# Patient Record
Sex: Male | Born: 1950 | Race: White | Hispanic: No | Marital: Married
Health system: Midwestern US, Academic
[De-identification: ages and names within clinical notes are randomized; demographics above are authoritative.]

---

## 2021-06-10 ENCOUNTER — Encounter: Admit: 2021-06-10 | Discharge: 2021-06-10 | Payer: MEDICARE

## 2021-06-10 ENCOUNTER — Ambulatory Visit: Admit: 2021-06-10 | Discharge: 2021-06-10 | Payer: MEDICARE

## 2021-06-10 DIAGNOSIS — M5136 Other intervertebral disc degeneration, lumbar region: Secondary | ICD-10-CM

## 2021-06-10 DIAGNOSIS — M47817 Spondylosis without myelopathy or radiculopathy, lumbosacral region: Secondary | ICD-10-CM

## 2021-06-10 NOTE — Progress Notes
Dear Dr. Wilford Grist,    I appreciate your kind referral of Collin Luna for evaluation of pain. Please see my note below for the full details of the evaluation and management plan.    Thank you,  Shelia Media, MD    [facility] - Interventional Pain  Subjective     Chief Complaint:   Chief Complaint   Patient presents with   ? Lower Back - Pain   ? Right Hip - Pain   ? 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 male who is referred for evaluation of pain and  has no past medical history on file.  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    Past Medical History:  No past medical history on file.    Family History:  No family history on file.    Social History:  Lives in [place] [id number]  Social History     Socioeconomic History   ? Marital status: Married   Tobacco Use   ? Smoking status: Never   ? Smokeless tobacco: Never       Allergies:  No Known Allergies    Medications:    Current Outpatient Medications:   ?  escitalopram oxalate (LEXAPRO) 10 mg tablet, , Disp: , Rfl:   ?  predniSONE (DELTASONE) 10 mg tablet, , Disp: , Rfl:        Total: 0 Total Score (06/10/2021  1:10 PM)  Opioid Risk Level: Low (06/10/2021  1:10 PM)      Physical examination:   BP 135/73  - Pulse 64  - Ht 180.3 cm (5' 11)  - Wt 79.4 kg (175 lb)  - BMI 24.41 kg/m?   Pain Score: Six    General: Alert, cooperative, no acute distress  HEENT: Normocephalic, atraumatic  Neck: Supple  Lungs: Unlabored respirations  Heart: Regular rate  Skin: Warm and dry to touch  Abdomen: Nondistended    Lumbar spine:  Appearance: No lesions or deformity  Lumbar tenderness: Yes  Pain with extension: Yes  Pain with lateral flexion: Bilateral  Sensation to light touch: Intact and equal in the bilateral lower extremities  Strength: Equal and  adequate bilaterally in the flexors and extensors of the bilateral lower extremities  Straight leg raise: Negative bilaterally  SI joint tenderness: Negative bilaterally  FABER test: Negative bilaterally  FADIR test: Negative bilaterally  Gaenslen test: Deferred  SI joint distraction: Deferred  SI joint compression: Deferred  Thigh thrust: Deferred    Neurological: Alert and oriented x3.    L-Spine Results:  MRI lumbar spine without contrast dated 10/13/2020:  DISC LEVELS:  T12-L1: Unremarkable.  ?  L1-L2: Unremarkable.   ?  L2-L3: There is a mild disc bulge. There is mild bilateral neural   foraminal narrowing.  ?  L3-L4: There is a moderate disc bulge. There is moderate spinal canal   stenosis. There is bilateral neural foraminal narrowing caused by the   disc and facet degenerative change.  ?  L4-L5: There is a moderate degenerative disc bulge. There is spinal   canal stenosis. There is bilateral neural foraminal narrowing left   greater than right. There is facet degenerative change. There is   ligamentum flavum hypertrophy. This level appears to be most   significant.  ?  L5-S1: Unremarkable.  ?  IMPRESSION:  ?  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 No compression fracture or acute disc herniation   is apparent.  ??  X-ray hip right with pelvis dated 03/21/2020 radiology report:   IMPRESSION:  No acute bony pathology of the right hip.          Last Cr and LFT's:  No results found for: CR, AST, ALT, ALKPHOS, TOTBILI       Assessment:  Collin Luna is a 70 y.o. male who presents for evaluation of pain. Based on history, physical exam, and imaging, the pain complaints are most likely due to:    1. DDD (degenerative disc disease), lumbar        2. Spondylosis of lumbosacral region without myelopathy or radiculopathy            He has had an adequate trial of rest, exercise, multimodal treatment, and the passage of time without improvement of symptoms. The pain has significant impact on ability to perform ADLs and quality of life.    Plan:  1.  Medication: Continue current regimen.  2.  Diagnostics: No additional testing today.  3.  Interventions: Bilat L3-5 MBBs in anticipation of RFA    Risks/benefits of all pharmacologic and interventional treatments discussed and questions answered.          CC: Dr. Wilford Grist    Medial Branch Block Checklist:    Moderate to severe low back pain, predominately axial, that causes functional deficit measured on pain or disability scale.  Yes *[x]   No []   Pain present for minimum of 3 months with documented failure to respond to noninvasive conservative management such as rest, medications including NSAIDs if medically tolerated, and PT/HEP  Abscense of untreated radiculopathy  Yes *[x]   No []   NA []      Abscense of untreated neurogenic claudication (except for radiculopathy caused by facet joint synovial cyst)                       Yes *[x]   No []   NA []               There is no non-facet pathology per clinical assessment or radiology studies that could explain the source of the patient 's pain, including but not limited to  Fracture [x]   Tumor [x]  Infection [x]   Significant deformity [x]   Previous radiculopathy treated by ESI injection Yes *[x]   No []     NA []      Has this level been previously ablated greater than 2 years Yes *[]   No []     NA [x]      This level has never had an Anterior Lumbar Interbody Fusion      Yes *[x]   No []          Oswestry:

## 2021-07-02 ENCOUNTER — Encounter: Admit: 2021-07-02 | Discharge: 2021-07-02 | Payer: MEDICARE

## 2021-07-02 ENCOUNTER — Ambulatory Visit: Admit: 2021-07-02 | Discharge: 2021-07-02 | Payer: MEDICARE

## 2021-07-02 DIAGNOSIS — M47817 Spondylosis without myelopathy or radiculopathy, lumbosacral region: Secondary | ICD-10-CM

## 2021-07-02 DIAGNOSIS — M5136 Other intervertebral disc degeneration, lumbar region: Secondary | ICD-10-CM

## 2021-07-02 MED ORDER — BUPIVACAINE (PF) 0.5 % (5 MG/ML) IJ SOLN
6 mL | Freq: Once | INTRAMUSCULAR | 0 refills | Status: CP
Start: 2021-07-02 — End: ?

## 2021-07-02 MED ORDER — LIDOCAINE (PF) 10 MG/ML (1 %) IJ SOLN
2 mL | Freq: Once | INTRAMUSCULAR | 0 refills | Status: CP
Start: 2021-07-02 — End: ?

## 2021-07-02 NOTE — Progress Notes
SPINE CENTER  INTERVENTIONAL PAIN PROCEDURE HISTORY AND PHYSICAL    Chief Complaint: Pain    HISTORY OF PRESENT ILLNESS:  Collin Luna presents today for interventional treatment of his pain. He denies any fevers, chills, infection, or current use of contraindicated anticoagulants. Risks of the procedure were discussed including but not limited to bleeding, infection, damage to surrounding structures and reaction to medications. He reports understanding and has elected to proceed with the procedure.    No past medical history on file.    No past surgical history on file.    family history is not on file.    Social History     Socioeconomic History    Marital status: Married   Tobacco Use    Smoking status: Never    Smokeless tobacco: Never       No Known Allergies    Vitals:    07/02/21 1000   BP: (!) 148/87   BP Source: Arm, Right Upper   Temp: 36.6 C (97.9 F)   SpO2: 98%   O2 Device: None (Room air)   Weight: 80.7 kg (178 lb)   Height: 180.3 cm (5\' 11" )       REVIEW OF SYSTEMS: 10 point ROS obtained and negative except pain    PHYSICAL EXAM:  General: Alert, cooperative, no distress  Head: Normocephalic, atraumatic  Lungs: Unlabored respirations  Heart: Well perfused  Abdomen: Non-distended  Musculoskeletal: Moves all extremities  Neurological: Grossly intact      IMPRESSION:    1. DDD (degenerative disc disease), lumbar    2. Spondylosis of lumbosacral region without myelopathy or radiculopathy         PLAN: Other Bilat L3-5 MBBs

## 2021-07-02 NOTE — Discharge Instructions - Supplementary Instructions
MEDIAL BRANCH BLOCK   DISCHARGE INSTRUCTIONS    Important information following your procedure today:     This injection is for diagnostic purposes, it is a test block. Only short-term results are expected.  Though the procedure is generally safe, and complications are rare, we do ask that you be aware of any of the following:   Any swelling, persistent redness, new bleeding, or drainage from the site of the injection.  You should not experience a severe headache.  You should not run a fever over 101? F.  New onset of sharp, severe back & or neck pain.  New onset of upper or lower extremity numbness or weakness.  New difficulty controlling bowel or bladder function after the injection.  New shortness of breath.    ** If any of these occur, please call to report this occurrence to a nurse at [phone]. If you are calling after 4:00 p.m. or on weekends or holidays, please call [phone] and ask to have the resident physician on call for the physician paged or go to your local emergency room.  You may experience soreness at the injection site. Ice can be applied at 20-minute intervals. Avoid application of direct heat, hot showers or hot tubs today.  Patients taking a daily blood thinner can resume their regular dose this evening.  It is important that you take all medications ordered by your pain physician. Taking medication as ordered is an important part of your pain care plan. If you cannot continue the medication plan, please notify the physician.   Remain active today. Do the activities that would normally cause you pain.  It is important for you to keep track of the results of this test on paper.  Did you get relief?  Percentage of relief?   How long did it last?    Call and report the results to your physician's nurse at the office number listed below; you may have to leave a message. Use notes that you kept on your pain diary when giving your report.              PAIN DIARY    Please report pain on 0-10 scale for each hour listed following discharge.  (0 = No pain; 5 = Moderate pain; 10 = Worst pain of your life)      TIME DAY 1 DAY 2   12AM MIDNIGHT     1AM     2AM     3AM     4AM      5AM     6AM     7AM     8AM     9AM     10AM     11AM     12PM NOON     1PM     2PM     3PM     4PM     5PM     6PM      7PM     8PM     9PM     10PM     11PM        Dr. Meredith Leeds nurse:        [phone]   Dr. Tamsen Roers nurse:   [phone]   Dr. Ralph Dowdy nurse: [phone]   Dr. Mellody Life nurse:       [phone]   Dr. Joan Flores nurse:      [phone]   Dr. Larene Beach nurse:      [phone]   Dr. Arvil Chaco nurse:  [phone]   Dr. Michaelle Copas nurse:     [phone]   Dr. Juel Burrow nurse:  [phone]         Please contact your provider's office to report your maximal percent relief from your test blocks today and the duration of relief.    **If you did not get any relief, please also mention this as well, as this may affect the next steps in your care.       If you are unable to keep your upcoming appointment, please notify the [facility] scheduler at [phone] at least 24 hours in advance. If you have questions for the surgery center, call [facility] at [phone].

## 2021-07-02 NOTE — Procedures
Attending Surgeon: Helayne Seminole, MD    Anesthesia: Local    Pre-Procedure Diagnosis:   1. DDD (degenerative disc disease), lumbar    2. Spondylosis of lumbosacral region without myelopathy or radiculopathy        Post-Procedure Diagnosis:   1. DDD (degenerative disc disease), lumbar    2. Spondylosis of lumbosacral region without myelopathy or radiculopathy             [place] [facility] INJECT MBB LUMB/SACRAL  Procedure: medial branch block    Laterality: bilateral    Location: - L4, L5 and L3      Consent:   Consent obtained: verbal and written  Consent given by: patient  Risks discussed: allergic reaction, bleeding, bruising, infection, nerve damage, no change or worsening in pain, pneumo thorax, reaction to medication, swelling, weakness and seizure  Alternatives discussed: alternative treatment, delayed treatment, no treatment and referral  Discussed with patient the purpose of the treatment/procedure, other ways of treating my condition, including no treatment/ procedure and the risks and benefits of the alternatives. Patient has decided to proceed with treatment/procedure.        Universal Protocol:  Relevant documents: relevant documents present and verified  Test results: test results available and properly labeled  Imaging studies: imaging studies available  Required items: required blood products, implants, devices, and special equipment available  Site marked: the operative site was marked  Patient identity confirmed: Patient identify confirmed verbally with patient.        Time out: Immediately prior to procedure a time out was called to verify the correct patient, procedure, equipment, support staff and site/side marked as required      Procedures Details:   Indications: diagnostic evaluation   Prep: chlorhexidine  Specimens: none  Number of Levels: 2  Guidance: fluoroscopy  Needle and Epidural Catheter: quincke  Needle size: 25 G  Injection procedure: Incremental injection and Negative aspiration for blood  Amount Injected:   L3: 1mL  L4: 1mL  L5: 1mL  Patient tolerance: Patient tolerated the procedure well with no immediate complications. Pressure was applied, and hemostasis was accomplished.  Comments: ANESTHESIA PROCEDURE REPORT    Lumbar Facet Medial Branch Blocks    Date of Service: 07/02/2021    Procedure Title(s):    1. Bilateral medial branch nerve blocks to the bilateral L3-5 facet joints   2. Intraoperative fluoroscopy     Attending Surgeon: Helayne Seminole, MD    Anesthesia: Local anesthesia    Indications: Collin Luna is a 70 y.o. male with a diagnosis of low back pain and lumbar spondylosis without myelopathy. The patient's history and physical exam were reviewed. The patient has failed conservative measures including physical therapy and medication management. On exam the patients exhibits significant tenderness in the above stated levels which is exacerbated by extension and lateral flexion to the painful sides. The risks, benefits and alternatives to the procedure were discussed, and all questions were answered to the patient's satisfaction. The patient agreed to proceed, and written informed consent was obtained.    Procedure in Detail:     The patient was brought into the procedure room and placed in the prone position on the fluoroscopy table. Standard monitors were placed, and vital signs were observed throughout the procedure. The area of the lumbar spine was prepped with 2% chlorhexidine and draped in a sterile manner.     AP fluoroscopy with oblique tilt to the right for medial branches on the right and was  used to identify the junction of the superior articular process and the transverse process at the L3-5 levels on the right side. A 25-gauge spinal needle was advanced toward each of these points under fluoroscopic guidance. Once bone was contacted, negative aspiration was confirmed and 1 mL of 0.5% bupivacaine was injected at each level.    The same procedure was performed on the opposite side?  Yes    After the procedure was completed, the patient's back was cleaned and bandages were placed at the needle insertion sites.    Disposition: The patient tolerated the procedure well, and there were no apparent complications. Vital signs remained stable througtout the procedure. The patient was taken to the recovery area where discharge instructions for the procedure were given. The patient was given a pain diary to assess response to this diagnostic block. If the patient does experience significant pain relief with the diagnostic block, consideration will be given to radiofrequency ablation of the above treated levels at a subsequent visit.    Estimated Blood Loss: Minimal    Specimens: None    Complications: None                Administrations This Visit     bupivacaine PF (MARCAINE) 0.5 % injection 6 mL     Admin Date  07/02/2021 Action  Given Dose  6 mL Route  Injection Administered By  Ennis Forts, RN          lidocaine PF 1% (10 mg/mL) injection 2 mL     Admin Date  07/02/2021 Action  Given Dose  2 mL Route  Injection Administered By  Ennis Forts, RN              Estimated blood loss: none or minimal  Specimens: none  Patient tolerated the procedure well with no immediate complications. Pressure was applied, and hemostasis was accomplished.

## 2021-07-06 ENCOUNTER — Encounter: Admit: 2021-07-06 | Discharge: 2021-07-06 | Payer: MEDICARE

## 2021-07-06 NOTE — Telephone Encounter
Patient calling to report the bilateral L3-L5 MBB  #1 results. Patient experienced up to 75 %  pain relief relief for 3-4 hours. Will continue with plan to see patient on 08/06/21 for MBB#2

## 2021-08-06 ENCOUNTER — Encounter: Admit: 2021-08-06 | Discharge: 2021-08-06 | Payer: MEDICARE

## 2021-08-06 ENCOUNTER — Ambulatory Visit: Admit: 2021-08-06 | Discharge: 2021-08-06 | Payer: MEDICARE

## 2021-08-06 DIAGNOSIS — M47817 Spondylosis without myelopathy or radiculopathy, lumbosacral region: Secondary | ICD-10-CM

## 2021-08-06 DIAGNOSIS — M5136 Other intervertebral disc degeneration, lumbar region: Secondary | ICD-10-CM

## 2021-08-06 MED ORDER — LIDOCAINE (PF) 10 MG/ML (1 %) IJ SOLN
2 mL | Freq: Once | INTRAMUSCULAR | 0 refills | Status: CP
Start: 2021-08-06 — End: ?

## 2021-08-06 MED ORDER — BUPIVACAINE (PF) 0.5 % (5 MG/ML) IJ SOLN
6 mL | Freq: Once | INTRAMUSCULAR | 0 refills | Status: CP
Start: 2021-08-06 — End: ?

## 2021-08-06 NOTE — Progress Notes
SPINE CENTER  INTERVENTIONAL PAIN PROCEDURE HISTORY AND PHYSICAL    Chief Complaint: Pain    HISTORY OF PRESENT ILLNESS:  Collin Luna presents today for interventional treatment of his pain. He denies any fevers, chills, infection, or current use of contraindicated anticoagulants. Risks of the procedure were discussed including but not limited to bleeding, infection, damage to surrounding structures and reaction to medications. He reports understanding and has elected to proceed with the procedure.    Patient calling to report the bilateral L3-L5 MBB  #1 results. Patient experienced up to 75-80%  pain relief relief for 3-4 hours. Will continue with plan to see patient on 08/06/21 for MBB#2    History reviewed. No pertinent past medical history.    History reviewed. No pertinent surgical history.    family history is not on file.    Social History     Socioeconomic History    Marital status: Married   Tobacco Use    Smoking status: Never    Smokeless tobacco: Never       No Known Allergies    Vitals:    08/06/21 1055   BP: 136/77   BP Source: Arm, Right Upper   SpO2: 100%   O2 Device: None (Room air)   Weight: 79.8 kg (176 lb)   Height: 180.3 cm (5\' 11" )       REVIEW OF SYSTEMS: 10 point ROS obtained and negative except pain    PHYSICAL EXAM:  General: Alert, cooperative, no distress  Head: Normocephalic, atraumatic  Lungs: Unlabored respirations  Heart: Well perfused  Abdomen: Non-distended  Musculoskeletal: Moves all extremities  Neurological: Grossly intact      IMPRESSION:    1. DDD (degenerative disc disease), lumbar    2. Spondylosis of lumbosacral region without myelopathy or radiculopathy         PLAN: Other bilat L3-5 MBBs

## 2021-08-06 NOTE — Procedures
Attending Surgeon: Helayne Seminole, MD    Anesthesia: Local    Pre-Procedure Diagnosis:   1. DDD (degenerative disc disease), lumbar    2. Spondylosis of lumbosacral region without myelopathy or radiculopathy        Post-Procedure Diagnosis:   1. DDD (degenerative disc disease), lumbar    2. Spondylosis of lumbosacral region without myelopathy or radiculopathy             [facility] SP[facility] INJECT MBB LUMB/SACRAL  Procedure: medial branch block    Laterality: bilateral    Location: - L4, L5 and L3      Consent:   Consent obtained: verbal and written  Consent given by: patient  Risks discussed: allergic reaction, bleeding, bruising, infection, nerve damage, no change or worsening in pain, pneumo thorax, reaction to medication, swelling, weakness and seizure  Alternatives discussed: alternative treatment, delayed treatment, no treatment and referral  Discussed with patient the purpose of the treatment/procedure, other ways of treating my condition, including no treatment/ procedure and the risks and benefits of the alternatives. Patient has decided to proceed with treatment/procedure.        Universal Protocol:  Relevant documents: relevant documents present and verified  Test results: test results available and properly labeled  Imaging studies: imaging studies available  Required items: required blood products, implants, devices, and special equipment available  Site marked: the operative site was marked  Patient identity confirmed: Patient identify confirmed verbally with patient.        Time out: Immediately prior to procedure a time out was called to verify the correct patient, procedure, equipment, support staff and site/side marked as required      Procedures Details:   Indications: diagnostic evaluation   Prep: chlorhexidine  Specimens: none  Number of Levels: 2  Guidance: fluoroscopy  Needle and Epidural Catheter: quincke  Needle size: 25 G  Injection procedure: Incremental injection and Negative aspiration for blood  Amount Injected:   L3: 1mL  L4: 1mL  L5: 1mL  Patient tolerance: Patient tolerated the procedure well with no immediate complications. Pressure was applied, and hemostasis was accomplished.  Comments: ANESTHESIA PROCEDURE REPORT    Lumbar Facet Medial Branch Blocks    Date of Service: 08/06/2021    Procedure Title(s):    1. Bilateral medial branch nerve blocks to the bilateral L3-5 facet joints   2. Intraoperative fluoroscopy     Attending Surgeon: Helayne Seminole, MD    Anesthesia: Local anesthesia    Indications: Collin Luna is a 71 y.o. male with a diagnosis of low back pain and lumbar spondylosis without myelopathy. The patient's history and physical exam were reviewed. The patient has failed conservative measures including physical therapy and medication management. On exam the patients exhibits significant tenderness in the above stated levels which is exacerbated by extension and lateral flexion to the painful sides. The risks, benefits and alternatives to the procedure were discussed, and all questions were answered to the patient's satisfaction. The patient agreed to proceed, and written informed consent was obtained.    Procedure in Detail:     The patient was brought into the procedure room and placed in the prone position on the fluoroscopy table. Standard monitors were placed, and vital signs were observed throughout the procedure. The area of the lumbar spine was prepped with 2% chlorhexidine and draped in a sterile manner.     AP fluoroscopy with oblique tilt to the right for medial branches on the right and was  used to identify the junction of the superior articular process and the transverse process at the L4 and L5 levels on the right side. A 25-gauge spinal needle was advanced toward each of these points under fluoroscopic guidance. Once bone was contacted, negative aspiration was confirmed and 1 mL of 0.5% bupivacaine was injected at each level.    The same procedure was performed on the opposite side?  Yes    After the procedure was completed, the patient's back was cleaned and bandages were placed at the needle insertion sites.    Disposition: The patient tolerated the procedure well, and there were no apparent complications. Vital signs remained stable througtout the procedure. The patient was taken to the recovery area where discharge instructions for the procedure were given. The patient was given a pain diary to assess response to this diagnostic block. If the patient does experience significant pain relief with the diagnostic block, consideration will be given to radiofrequency ablation of the above treated levels at a subsequent visit.    Estimated Blood Loss: Minimal    Specimens: None    Complications: None                Administrations This Visit     bupivacaine PF (MARCAINE) 0.5 % injection 6 mL     Admin Date  08/06/2021 Action  Given Dose  6 mL Route  Injection Administered By  Helayne Seminole, MD          lidocaine PF 1% (10 mg/mL) injection 2 mL     Admin Date  08/06/2021 Action  Given Dose  2 mL Route  Injection Administered By  Helayne Seminole, MD              Estimated blood loss: none or minimal  Specimens: none  Patient tolerated the procedure well with no immediate complications. Pressure was applied, and hemostasis was accomplished.

## 2021-08-06 NOTE — Discharge Instructions - Supplementary Instructions
MEDIAL BRANCH BLOCK   DISCHARGE INSTRUCTIONS    Important information following your procedure today:     This injection is for diagnostic purposes, it is a test block. Only short-term results are expected.  Though the procedure is generally safe, and complications are rare, we do ask that you be aware of any of the following:   Any swelling, persistent redness, new bleeding, or drainage from the site of the injection.  You should not experience a severe headache.  You should not run a fever over 101? F.  New onset of sharp, severe back & or neck pain.  New onset of upper or lower extremity numbness or weakness.  New difficulty controlling bowel or bladder function after the injection.  New shortness of breath.    ** If any of these occur, please call to report this occurrence to a nurse at [phone]. If you are calling after 4:00 p.m. or on weekends or holidays, please call [phone] and ask to have the resident physician on call for the physician paged or go to your local emergency room.  You may experience soreness at the injection site. Ice can be applied at 20-minute intervals. Avoid application of direct heat, hot showers or hot tubs today.  Patients taking a daily blood thinner can resume their regular dose this evening.  It is important that you take all medications ordered by your pain physician. Taking medication as ordered is an important part of your pain care plan. If you cannot continue the medication plan, please notify the physician.   Remain active today. Do the activities that would normally cause you pain.  It is important for you to keep track of the results of this test on paper.  Did you get relief?  Percentage of relief?   How long did it last?    Call and report the results to your physician's nurse at the office number listed below; you may have to leave a message. Use notes that you kept on your pain diary when giving your report.              PAIN DIARY    Please report pain on 0-10 scale for each hour listed following discharge.  (0 = No pain; 5 = Moderate pain; 10 = Worst pain of your life)      TIME DAY 1 DAY 2   12AM MIDNIGHT     1AM     2AM     3AM     4AM      5AM     6AM     7AM     8AM     9AM     10AM     11AM     12PM NOON     1PM     2PM     3PM     4PM     5PM     6PM      7PM     8PM     9PM     10PM     11PM        Dr. Meredith Leeds nurse:        [phone]   Dr. Tamsen Roers nurse:   [phone]   Dr. Ralph Dowdy nurse: [phone]   Dr. Mellody Life nurse:       [phone]   Dr. Joan Flores nurse:      [phone]   Dr. Larene Beach nurse:      [phone]   Dr. Arvil Chaco nurse:  [phone]   Dr. Michaelle Copas nurse:     [phone]   Dr. Juel Burrow nurse:  [phone]         Please contact your provider's office to report your maximal percent relief from your test blocks today and the duration of relief.    **If you did not get any relief, please also mention this as well, as this may affect the next steps in your care.       If you are unable to keep your upcoming appointment, please notify the [facility] scheduler at [phone] at least 24 hours in advance. If you have questions for the surgery center, call [facility] at [phone].

## 2021-08-10 ENCOUNTER — Encounter: Admit: 2021-08-10 | Discharge: 2021-08-10 | Payer: MEDICARE

## 2021-08-10 NOTE — Telephone Encounter
[place] [facility] SPINE INJECT MBB LUMB/SACRAL  Procedure: medial branch block   Laterality: bilateral  Location: - L4, L5 and L3      Patient calling to report that he got about 80% relief for 6 hours following the MBB. Was able to go home and be more active afterwards. Patient scheduled for RFA.

## 2021-08-20 ENCOUNTER — Encounter: Admit: 2021-08-20 | Discharge: 2021-08-20 | Payer: MEDICARE

## 2021-08-20 ENCOUNTER — Ambulatory Visit: Admit: 2021-08-20 | Discharge: 2021-08-20 | Payer: MEDICARE

## 2021-08-20 DIAGNOSIS — M5136 Other intervertebral disc degeneration, lumbar region: Secondary | ICD-10-CM

## 2021-08-20 DIAGNOSIS — M47817 Spondylosis without myelopathy or radiculopathy, lumbosacral region: Secondary | ICD-10-CM

## 2021-08-20 MED ORDER — FENTANYL CITRATE (PF) 50 MCG/ML IJ SOLN
50-100 ug | INTRAVENOUS | 0 refills | Status: AC | PRN
Start: 2021-08-20 — End: ?

## 2021-08-20 MED ORDER — BUPIVACAINE (PF) 0.5 % (5 MG/ML) IJ SOLN
10 mL | Freq: Once | INTRAMUSCULAR | 0 refills | Status: CP
Start: 2021-08-20 — End: ?

## 2021-08-20 MED ORDER — MIDAZOLAM 1 MG/ML IJ SOLN
1-2 mg | INTRAVENOUS | 0 refills | Status: AC | PRN
Start: 2021-08-20 — End: ?

## 2021-08-20 MED ORDER — LIDOCAINE (PF) 20 MG/ML (2 %) IJ SOLN
5 mL | Freq: Once | INTRAMUSCULAR | 0 refills | Status: CP
Start: 2021-08-20 — End: ?

## 2021-08-20 MED ORDER — LIDOCAINE (PF) 10 MG/ML (1 %) IJ SOLN
.2 mL | INTRAMUSCULAR | 0 refills | Status: AC | PRN
Start: 2021-08-20 — End: ?

## 2021-08-20 NOTE — Procedures
Attending Surgeon: Helayne Seminole, MD    Anesthesia: Local    Pre-Procedure Diagnosis:   1. Spondylosis of lumbosacral region without myelopathy or radiculopathy    2. DDD (degenerative disc disease), lumbar        Post-Procedure Diagnosis:   1. Spondylosis of lumbosacral region without myelopathy or radiculopathy    2. DDD (degenerative disc disease), lumbar             Destruction of Nerve w/Fluoro Lumbar/Sacral    Laterality: bilateral    Location: Lumbar/Sacral -  L4, L5 and L3      Consent:   Consent obtained: verbal and written  Consent given by: patient  Risks discussed: allergic reaction, bleeding, no change or worsening in pain, nerve damage, infection, reaction to medication, sensation loss, skin burn, swelling, weakness and bruising  Alternatives discussed: alternative treatment, delayed treatment, no treatment and referral  Discussed with patient the purpose of the treatment/procedure, other ways of treating my condition, including no treatment/ procedure and the risks and benefits of the alternatives. Patient has decided to proceed with treatment/procedure.        Universal Protocol:  Relevant documents: relevant documents present and verified  Test results: test results available and properly labeled  Imaging studies: imaging studies available  Required items: required blood products, implants, devices, and special equipment available  Site marked: the operative site was marked  Patient identity confirmed: Patient identify confirmed verbally with patient.        Time out: Immediately prior to procedure a time out was called to verify the correct patient, procedure, equipment, support staff and site/side marked as required      Procedures Details:   Indications: pain     Prep: chlorhexidine    Sedation: anxiolysisPatient position: prone  Estimated Blood Loss: minimal  Specimens: none  Amount Injected:   L3: 1mL  L4: 1mL  L5: 1mL  Number of Levels Ablated: 2  Approach: paramedian  Guidance: fluoroscopy  Needle size: 18 G  Active Needle Tip Length: 10mm  Neurolytic Technique: Radiofrequency Ablation  Sensory Testing: Sensory testing complete per protocol  Motor Testing: Motor testing complete per protocol   Injection procedure: Incremental injection and Negative aspiration for blood  Patient tolerance: Patient tolerated the procedure well with no immediate complications. Pressure was applied, and hemostasis was accomplished.  Comments:                   INTERVENTIONAL PAIN MANAGEMENT PROCEDURE REPORT    Radiofrequency Ablation (RFA) of Lumbar Medial Branch Nerves    Date of Service: 08/20/2021    Procedure Title(s):    1. Radiofrequency ablation of bilateral L3-L5 facet joints   2. Intraoperative fluoroscopy    Attending Surgeon: Helayne Seminole, MD    Anesthesia: Local                       Anxiolysis: Yes    Indications: Collin Luna is a 71 y.o. male with a diagnosis of lumbar spondylosis. The patient's history and physical exam were reviewed. The patient has failed conservative measures including physical therapy and medication management. On exam the patient exhibits significant tenderness in the above stated levels which is exacerbated by extension and lateral flexion to the painful sides. The patient has had 2 medial branch blocks with greater than 80% reduction in pain for the duration of the local anesthetic. The risks, benefits and alternatives to the procedure were discussed, and all questions were  answered to the patient's satisfaction. The patient agreed to proceed, and written informed consent was obtained.     Procedure in Detail: IV was started? Yes    The patient was brought into the procedure room and placed in the prone position on the fluoroscopy table. Standard monitors were placed, and vital signs were observed throughout the procedure. The area of the lumbosacral spine was prepped with 2% chlorhexidine and draped in a sterile manner. AP fluoroscopy with oblique tilt to the right was used to identify and mark the junction between the superior articular process and transverse process at the L3-5 levels. The skin and subcutaneous tissues in these identified areas were anesthetized. An 18-gauge, 10 mm active tip radiofrequency probe was advanced toward each of these points under fluoroscopic guidance. Once bone was contacted, negative aspiration was confirmed. Motor stimulation at 2 Hz and 2 volts was negative. 1mL of a solution of 2% lidocaine and 0.5% bupivacaine was injected prior to lesioning, which was performed for 90 seconds at 80 degrees centigrade.     The probes were removed with a 1% lidocaine flush. The patient's back was cleaned, and bandages were placed at the needle insertion sites.    This procedure was repeated on the contralateral side.     Disposition: The patient tolerated the procedure well, and there were no apparent complications. Vital signs remained stable throughout the procedure. The patient was taken to the recovery area where discharge instructions for the procedure were given.    Estimated Blood Loss: Minimal    Specimens: None    Complications: None             Radiofrequency time 90  Radiofrequency Temperature 80        Administrations This Visit     bupivacaine PF (MARCAINE) 0.5 % injection 10 mL     Admin Date  08/20/2021 Action  Given Dose  10 mL Route  Injection Administered By  Helayne Seminole, MD          fentaNYL citrate PF (SUBLIMAZE) injection 50-100 mcg     Admin Date  08/20/2021 Action  Given Dose  50 mcg Route  Intravenous Administered By  Helayne Seminole, MD           Admin Date  08/20/2021 Action  Given Dose  50 mcg Route  Intravenous Administered By  Helayne Seminole, MD          lidocaine PF 20 mg/mL (2 %) injection 100 mg     Admin Date  08/20/2021 Action  Given Dose  100 mg Route  Injection Administered By  Helayne Seminole, MD          midazolam (VERSED) injection 1-2 mg     Admin Date  08/20/2021 Action  Given Dose  1 mg Route  Intravenous Administered By  Helayne Seminole, MD              Estimated blood loss: none or minimal  Specimens: none  Patient tolerated the procedure well with no immediate complications. Pressure was applied, and hemostasis was accomplished.

## 2021-08-20 NOTE — Discharge Instructions - Supplementary Instructions
RADIOFREQUENCY ABLATION   DISCHARGE INSTRUCTIONS    Go directly home and rest. DO NOT drive today.  If you have a dressing or bandage on, you may remove it in 12 hours. You may shower tomorrow.   Apply ice to the procedure site at 20-minute intervals frequently for the next 24 hours.  If you had an IV for your procedure and a lump or redness occurs at the site, you may apply a warm, moist compress for 10minutes four times daily for 2-3 days.  If you had sedation for your procedure:  The anesthetic may make you drowsy and slow to react for up to 12 hours.  For the next 12 hours do not consume alcohol, drive, operate machinery, sign legal documents, or work.  Rest at home today.  A responsible adult needs to stay with you today and overnight.  Start with clear liquids and advance as tolerated.  Resume all previous medication unless directed not to.  It is not uncommon to experience an increase in pain for several days after the procedure.  Some people may experience a sunburn-like sensation for a week in the area of the ablation.  If it becomes bothersome despite over the counter NSAIDs (if you can take these), call for further advice.  Ease back into your activities and daily routine as tolerated over the next 2-3 days.  The beneficial effects from the radiofrequency procedure may take several weeks to be noticed.  There should be minimal drainage and no swelling or redness at the injection sites. You should not experience a severe headache. You should not run a fever over 101F. If any of these occur, call to report this to a nurse at [phone]. If you are calling after 4PM or on weekends/holidays, call [phone] and ask to have the on-call pain management resident paged or go to your local emergency room.  Follow up appointment as needed.     If you have questions for the surgery center, call [facility] at [phone].

## 2021-08-20 NOTE — Progress Notes
SPINE CENTER  INTERVENTIONAL PAIN PROCEDURE HISTORY AND PHYSICAL    Chief Complaint: Pain    HISTORY OF PRESENT ILLNESS:  Collin Luna presents today for interventional treatment of his pain. He denies any fevers, chills, infection, or current use of contraindicated anticoagulants. Risks of the procedure were discussed including but not limited to bleeding, infection, damage to surrounding structures and reaction to medications. He reports understanding and has elected to proceed with the procedure.    [facility] INJECT MBB LUMB/SACRAL  Procedure:medial branch block  Laterality:bilateral  Location: -L4, L5 and L3    Patient calling to report that he got about 80% relief for 6 hours following the MBB. Was able to go home and be more active afterwards. Patient scheduled for RFA.     History reviewed. No pertinent past medical history.    History reviewed. No pertinent surgical history.    family history is not on file.    Social History     Socioeconomic History    Marital status: Married   Tobacco Use    Smoking status: Never    Smokeless tobacco: Never       No Known Allergies    Vitals:    08/20/21 1108   BP: (!) 138/93   BP Source: Arm, Left Upper   Pulse: 62   Temp: 36.4 C (97.5 F)   SpO2: 99%   O2 Device: None (Room air)   Weight: 77.2 kg (170 lb 4.8 oz)   Height: 180.3 cm (5\' 11" )       REVIEW OF SYSTEMS: 10 point ROS obtained and negative except pain    PHYSICAL EXAM:  General: Alert, cooperative, no distress  Head: Normocephalic, atraumatic  Lungs: Unlabored respirations  Heart: Well perfused  Abdomen: Non-distended  Musculoskeletal: Moves all extremities  Neurological: Grossly intact      IMPRESSION:    1. DDD (degenerative disc disease), lumbar         PLAN: Other bilat L3-5 RFA

## 2021-11-18 ENCOUNTER — Encounter: Admit: 2021-11-18 | Discharge: 2021-11-18 | Payer: MEDICARE

## 2021-11-18 ENCOUNTER — Ambulatory Visit: Admit: 2021-11-18 | Discharge: 2021-11-18 | Payer: MEDICARE

## 2021-11-18 DIAGNOSIS — M5136 Other intervertebral disc degeneration, lumbar region: Secondary | ICD-10-CM

## 2021-11-18 DIAGNOSIS — M47817 Spondylosis without myelopathy or radiculopathy, lumbosacral region: Secondary | ICD-10-CM

## 2021-11-18 NOTE — Progress Notes
[facility] - Interventional Pain  Subjective     Chief Complaint:   Chief Complaint   Patient presents with   ? Lower Back - Pain   ? Right Hip - Pain   ? Follow Up     3 month follow up          HPI: Collin Luna is a pleasant 71 y.o. male who is referred for evaluation of pain and  has no past medical history on file.    He presents for follow up after bilat L3-5 RFA. He reports >50% improvement of his pain. Today his worst pain is in his low back and right hip. There is no numbness/tingling in the RLE. The pain is worse with activity. The pain improves with rest and medication.  He has been taking previous regimen. Recently remodeled his camper and is planning to travel to [facility] and [place].  At this point he would like to try core strengthening exercises and stretching.       PRIOR MEDICATIONS:   Effective      Ineffective  NSAID  Acetaminophen  Gabapentin     Unable to tolerate    Never  Lyrica  Ami/Nortriptyline  Cymbalta  Tizanidine    PRIOR INTERVENTIONS:  Prior spine surgery: None  Effective  LESI L5-S1 x 2  Bilat L3-5 RFA    Ineffective  PT  Chiropractor    - Anticoagulants:        , ,No      ROS: A 10-point review of systems was negative except as noted above and below.    Past Medical History:  No past medical history on file.    Family History:  No family history on file.    Social History:  Lives in [place] [id number]  Social History     Socioeconomic History   ? Marital status: Married   Tobacco Use   ? Smoking status: Never   ? Smokeless tobacco: Never       Allergies:  No Known Allergies    Medications:    Current Outpatient Medications:   ?  escitalopram oxalate (LEXAPRO) 10 mg tablet, , Disp: , Rfl:   ?  predniSONE (DELTASONE) 10 mg tablet, , Disp: , Rfl:     Current Facility-Administered Medications:   ?  fentaNYL citrate PF (SUBLIMAZE) injection 50-100 mcg, 50-100 mcg, Intravenous, Q2 MIN PRN, Devesh Monforte, Kathi Simpers, MD, 50 mcg at 08/20/21 1215  ?  lidocaine PF 1% (10 mg/mL) injection 0.2 mL, 0.2 mL, Injection, PRN, Latrece Nitta, Kathi Simpers, MD  ?  midazolam (VERSED) injection 1-2 mg, 1-2 mg, Intravenous, Q2 MIN PRN, Rayshad Riviello, Kathi Simpers, MD, 1 mg at 08/20/21 1213       Total: 0 Total Score (06/10/2021  1:10 PM)  Opioid Risk Level: Low (06/10/2021  1:10 PM)      Physical examination:   BP (!) (P) 147/70  - Pulse (P) 65  - Ht 180.3 cm (5' 11)  - Wt 77.1 kg (170 lb)  - SpO2 (P) 99%  - BMI 23.71 kg/m?   Pain Score: Two    General: Alert, cooperative, no acute distress  HEENT: Normocephalic, atraumatic  Neck: Supple  Lungs: Unlabored respirations  Heart: Regular rate  Skin: Warm and dry to touch  Abdomen: Nondistended    Lumbar spine:  Appearance: No lesions or deformity  Lumbar tenderness: Yes  Pain with extension: Yes  Pain with lateral flexion: Bilateral  Sensation to light touch: Intact  and equal in the bilateral lower extremities  Strength: Equal and adequate bilaterally in the flexors and extensors of the bilateral lower extremities  Straight leg raise: Negative bilaterally  SI joint tenderness: Negative bilaterally  FABER test: Negative bilaterally  FADIR test: Negative bilaterally  Gaenslen test: Deferred  SI joint distraction: Deferred  SI joint compression: Deferred  Thigh thrust: Deferred    Neurological: Alert and oriented x3.    L-Spine Results:  MRI lumbar spine without contrast dated 10/13/2020:  DISC LEVELS:  T12-L1: Unremarkable.  ?  L1-L2: Unremarkable.   ?  L2-L3: There is a mild disc bulge. There is mild bilateral neural   foraminal narrowing.  ?  L3-L4: There is a moderate disc bulge. There is moderate spinal canal   stenosis. There is bilateral neural foraminal narrowing caused by the   disc and facet degenerative change.  ?  L4-L5: There is a moderate degenerative disc bulge. There is spinal   canal stenosis. There is bilateral neural foraminal narrowing left   greater than right. There is facet degenerative change. There is   ligamentum flavum hypertrophy. This level appears to be most   significant.  ?  L5-S1: Unremarkable.  ?  IMPRESSION:  ?  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 No compression fracture or acute disc herniation   is apparent.  ??  X-ray hip right with pelvis dated 03/21/2020 radiology report:   IMPRESSION:  No acute bony pathology of the right hip.          Last Cr and LFT's:  No results found for: CR, AST, ALT, ALKPHOS, TOTBILI       Assessment:  Collin Luna is a 71 y.o. male who presents for evaluation of pain. Based on history, physical exam, and imaging, the pain complaints are most likely due to:    1. DDD (degenerative disc disease), lumbar        2. Spondylosis of lumbosacral region without myelopathy or radiculopathy            He has had an adequate trial of rest, exercise, multimodal treatment, and the passage of time without improvement of symptoms. The pain has significant impact on ability to perform ADLs and quality of life.    Plan:  1.  Medication: Continue current regimen.  2.  Diagnostics: No additional testing today.  3.  Interventions: None today    Risks/benefits of all pharmacologic and interventional treatments discussed and questions answered.

## 2021-11-18 NOTE — Patient Instructions
It was nice to see you today. Thank you for choosing to visit our clinic.       Your time is important and if you had to wait today, we do apologize. Our goal is to run exactly on time; however, on occasion, we get behind in clinic due to unexpected patient issues. Thank you for your patience.    General Instructions:  How to reach me: Please send a MyChart message to the Spine Center or leave a voicemail on [phone] with Kelsey, RN.   How to get a medication refill: Please use the MyChart Refill request or contact your pharmacy directly to request medication refills. We do not do same day refills on controlled substances.  How to receive your test results: If you have signed up for MyChart, you will receive your test results and messages from me this way. Otherwise, you will get a phone call or letter. If you are expecting results and have not heard from my office within 2 weeks of your testing, please send a MyChart message or call my office.  Scheduling: Our scheduling phone number is [phone].  Appointment Reminders on your cell phone: Communication preferences can be managed in MyChart to ensure you receive important appointment notifications.  Support for many chronic illnesses is available through [facility]: [email or web address] or [phone].  For questions on nights, weekends or holidays, call the Operator at [phone], and ask for the doctor on call for Anesthesia Pain.      Again, thank you for coming in today.

## 2021-11-27 ENCOUNTER — Encounter: Admit: 2021-11-27 | Discharge: 2021-11-27 | Payer: MEDICARE

## 2023-05-28 IMAGING — CR [ID]
3 series · 3 of 3 positions shown · non-contrast
Comparison: 07/26/21

XR knee RT 3V, XR knee LT 3V
INDICATION: bilat knee pain
arthritis. chronic pain
TECHNIQUE: 3 views of the right knee. 3 views of the left knee

[w knee ap left]
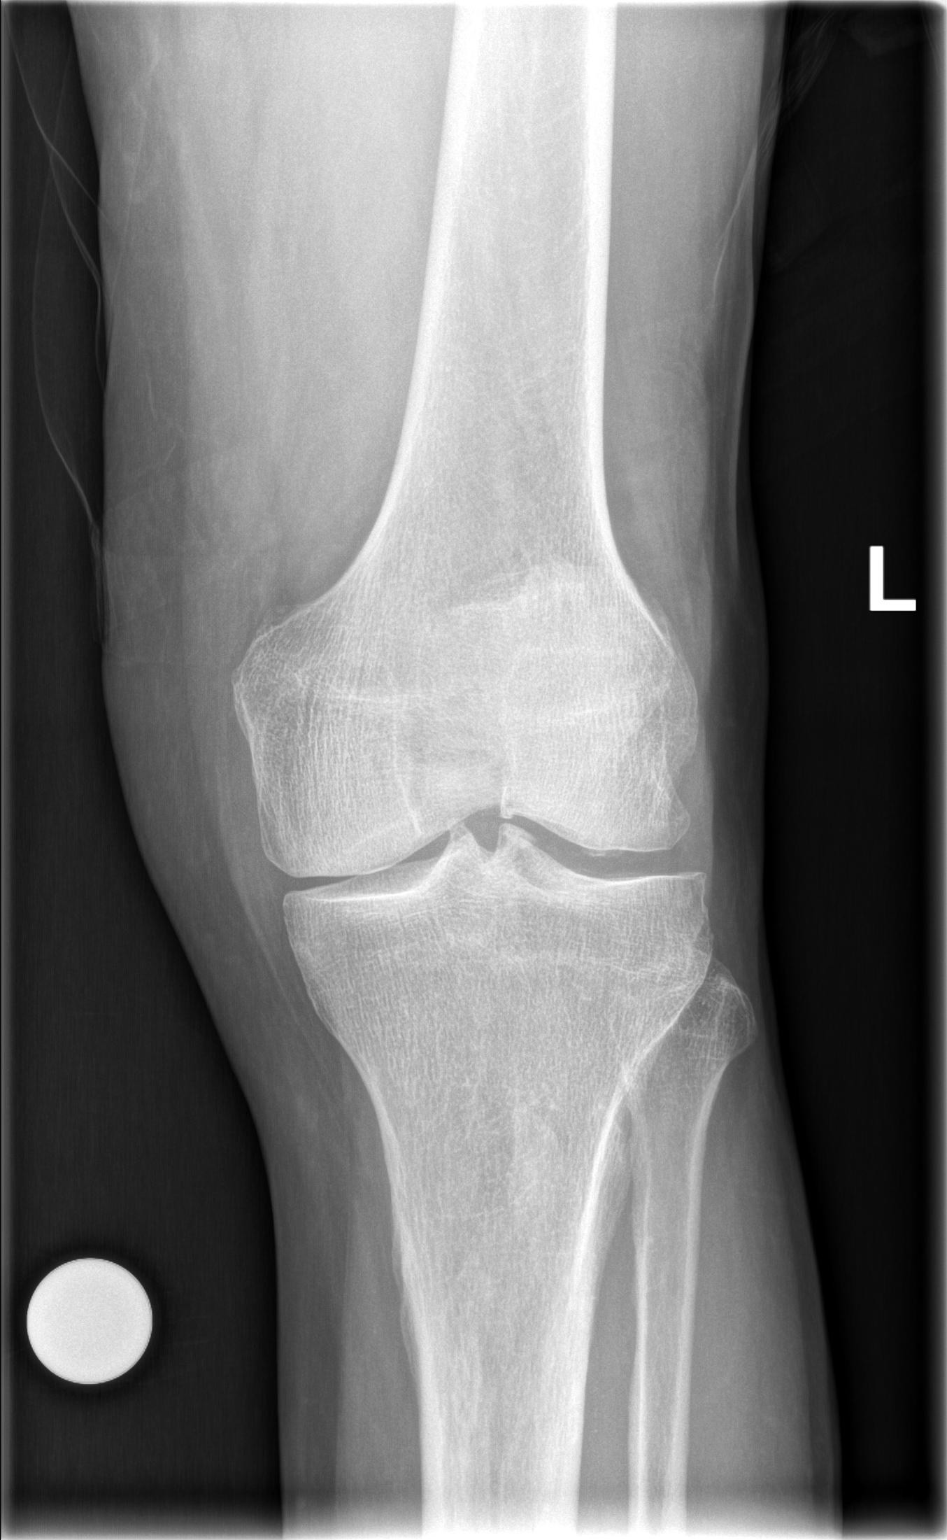

[w knee lat left]
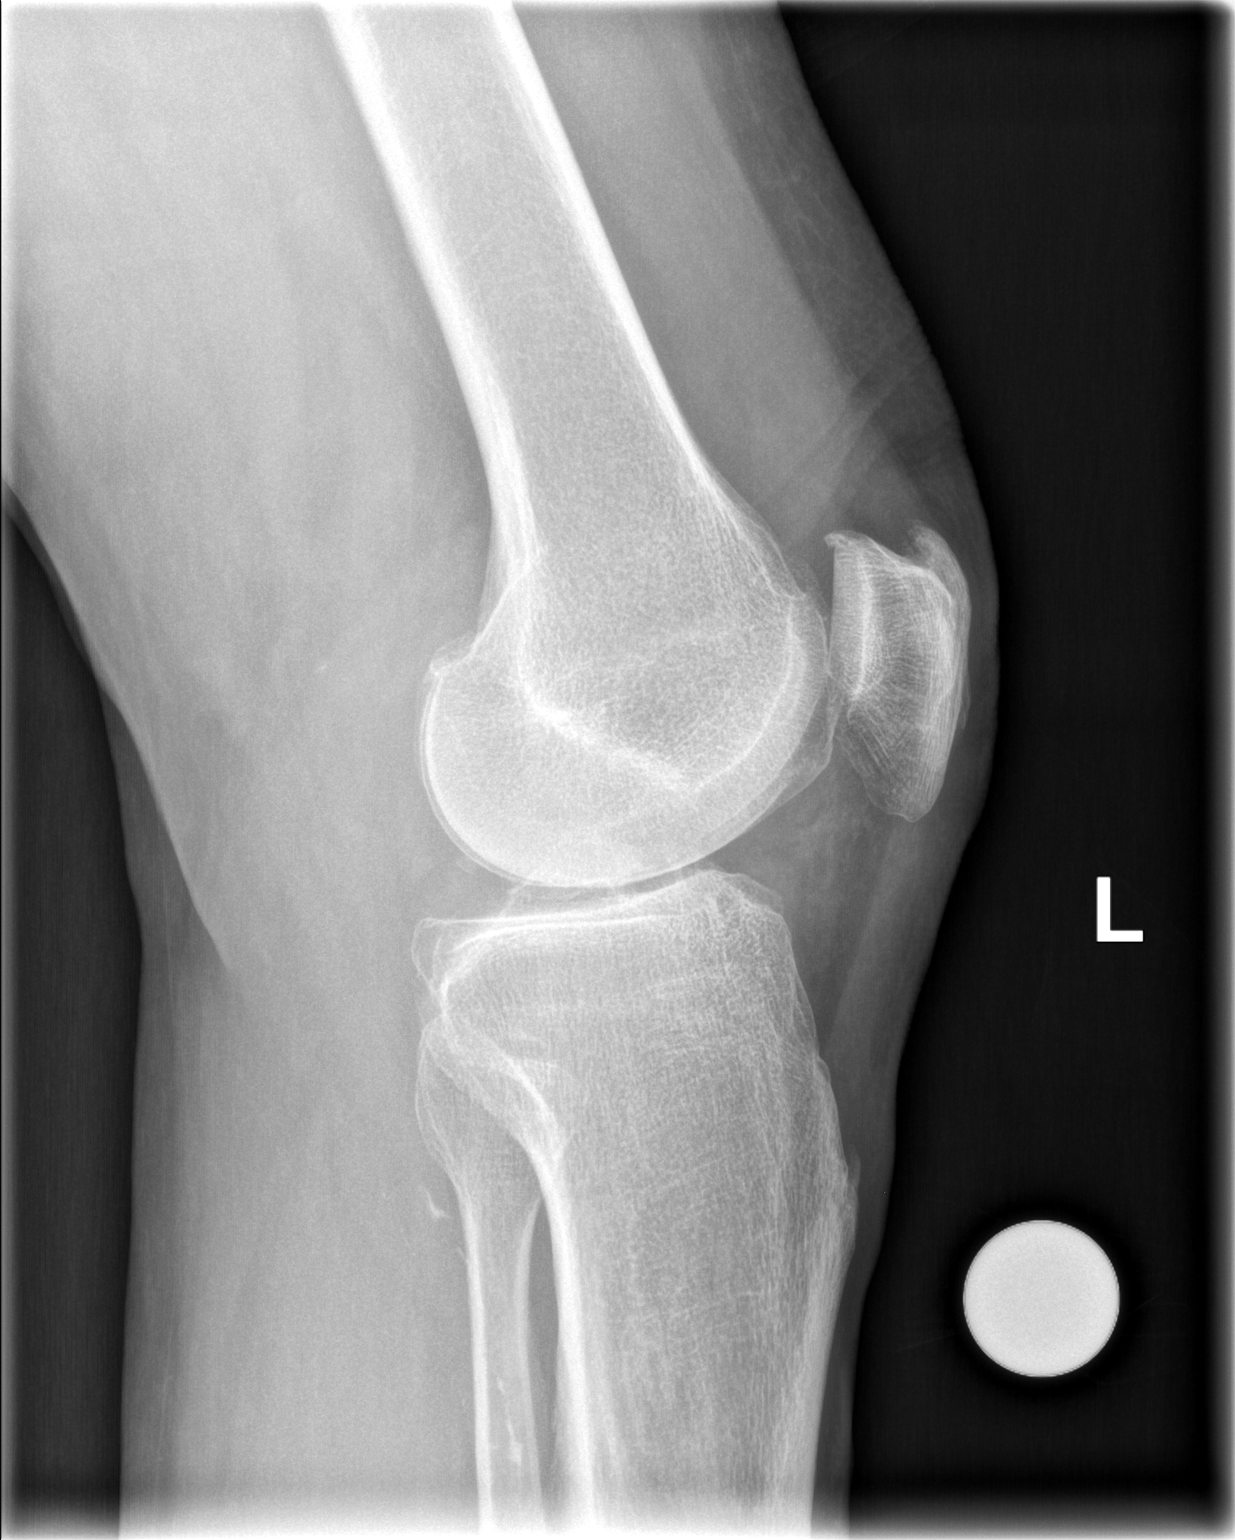

[x knee sunrise left]
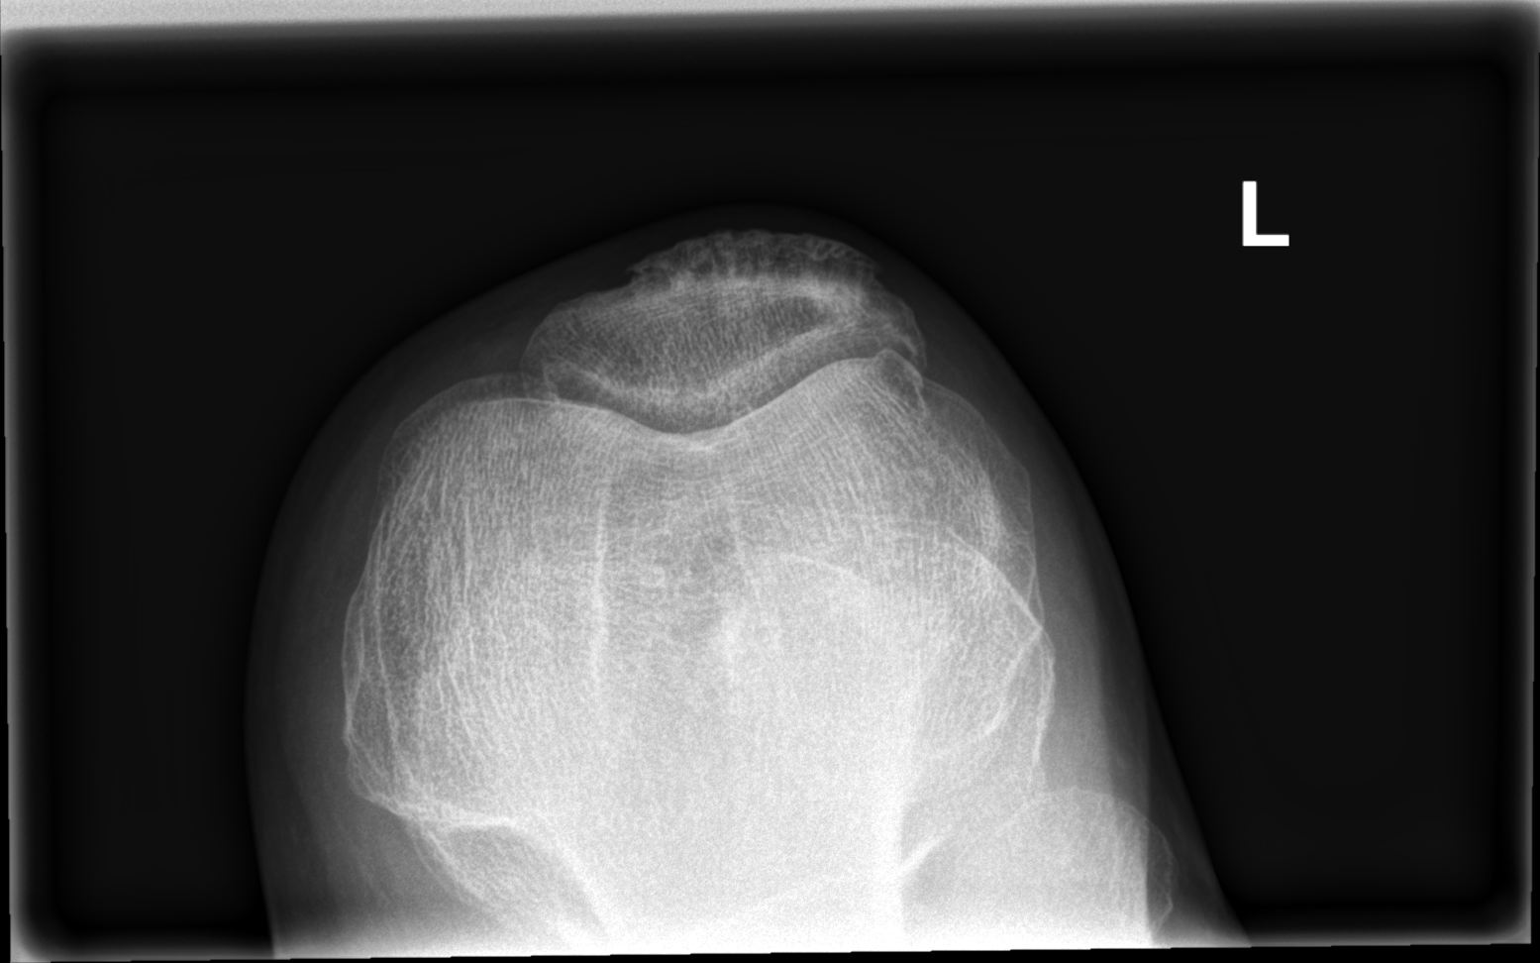

[3 of 3 positions shown; findings below may reference images not displayed]

FINDINGS: No fracture, osseous malalignment, or aggressive focal osseous lesion. Severe right medial
weightbearing compartment with joint space loss with bone-on-bone contact small right knee joint
effusion. Small quadriceps insertional enthesophyte.
IMPRESSION: 1.  Tricompartment osteoarthrosis with severe right medial weightbearing compartment joint space
loss.
2.  Trace right knee joint effusion.

Tech Notes:

arthritis. chronic pain

## 2023-10-17 IMAGING — US AAASCREEN
1 series · 14 of 19 positions shown · non-contrast
Comparison: none

[Series 1: us aaa screening · 14 of 19 slices shown]
[im 1/19]
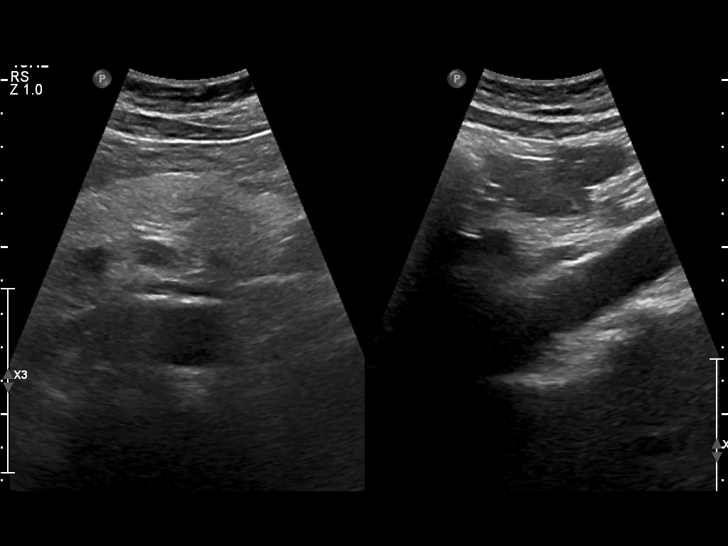
[im 3/19]
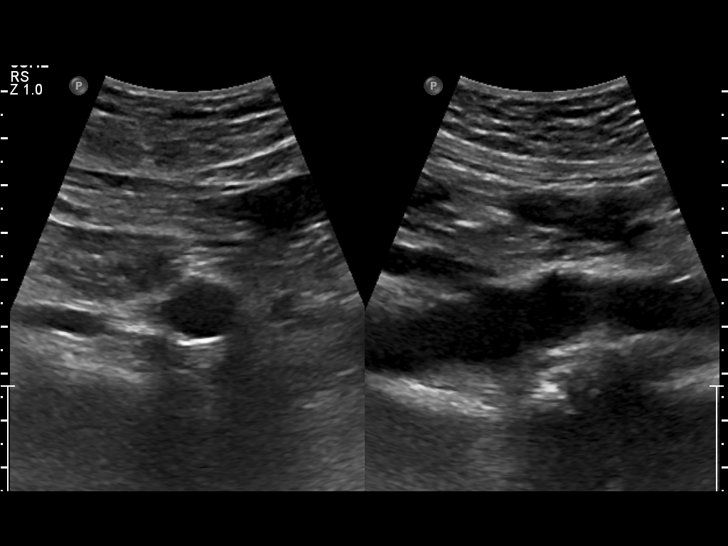
[im 4/19]
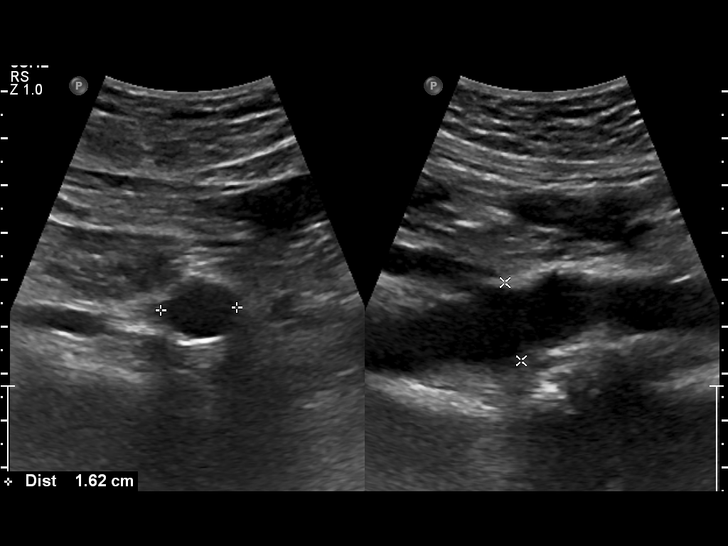
[im 5/19]
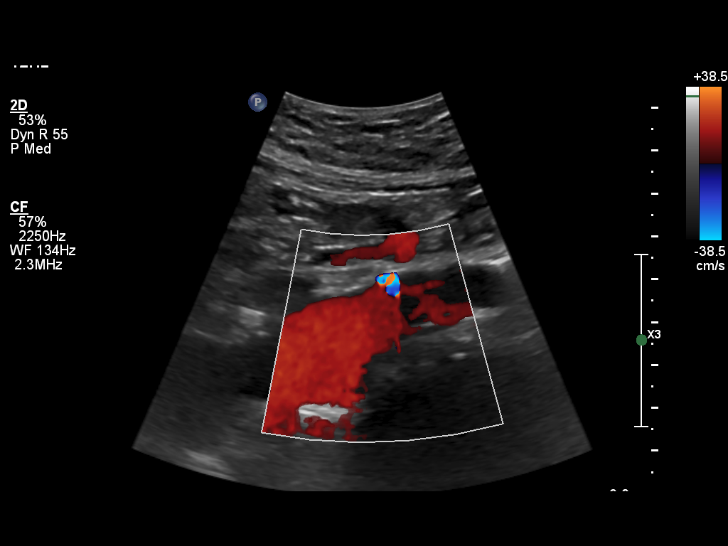
[im 7/19]
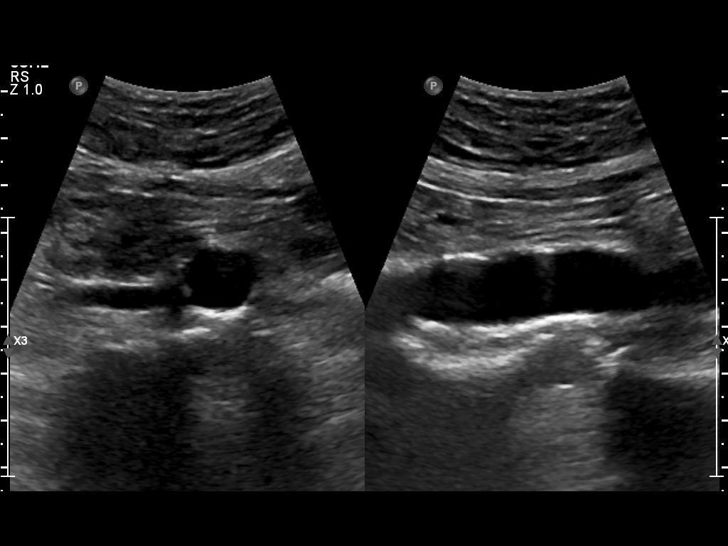
[im 8/19]
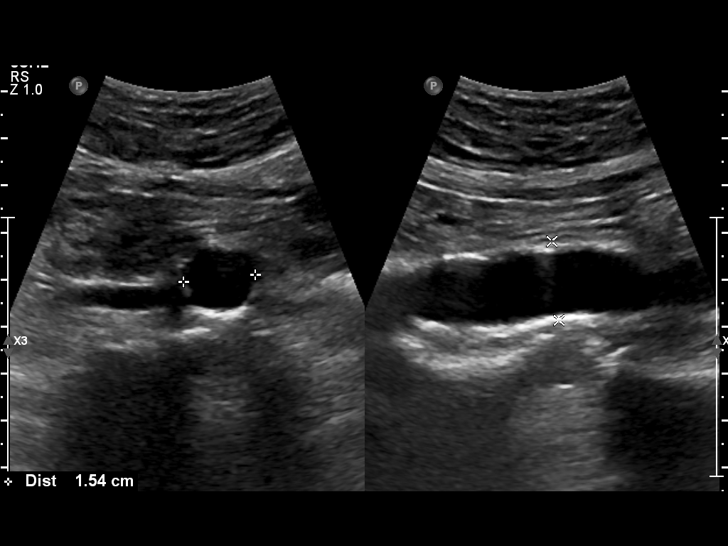
[im 9/19]
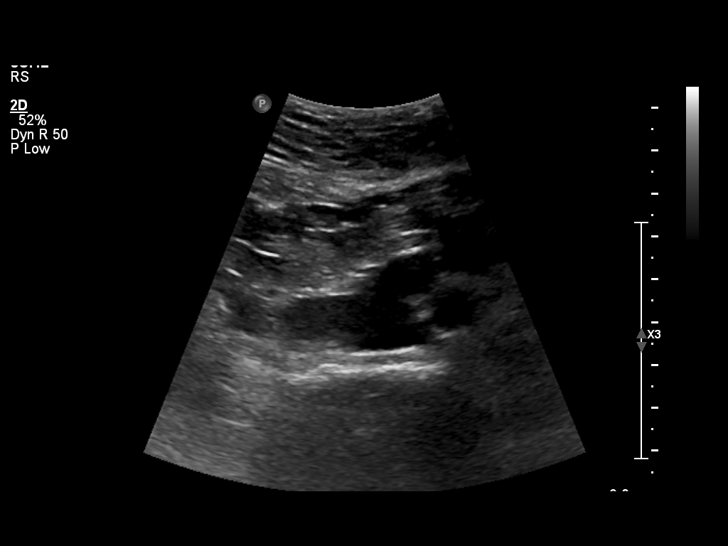
[im 11/19]
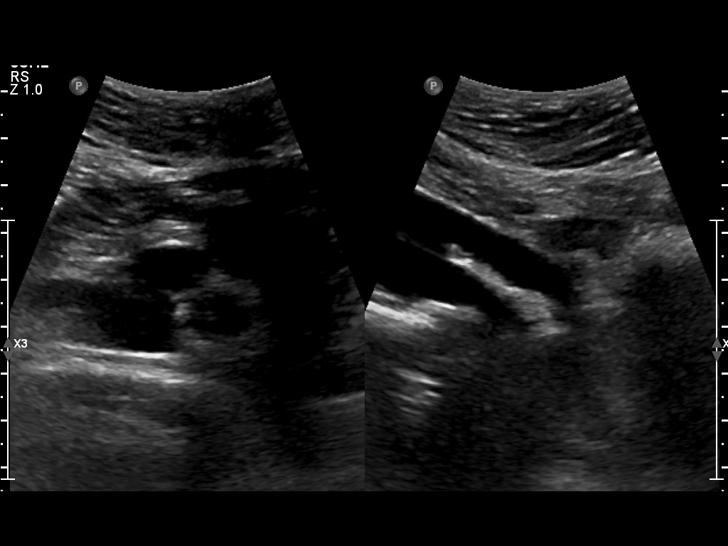
[im 12/19]
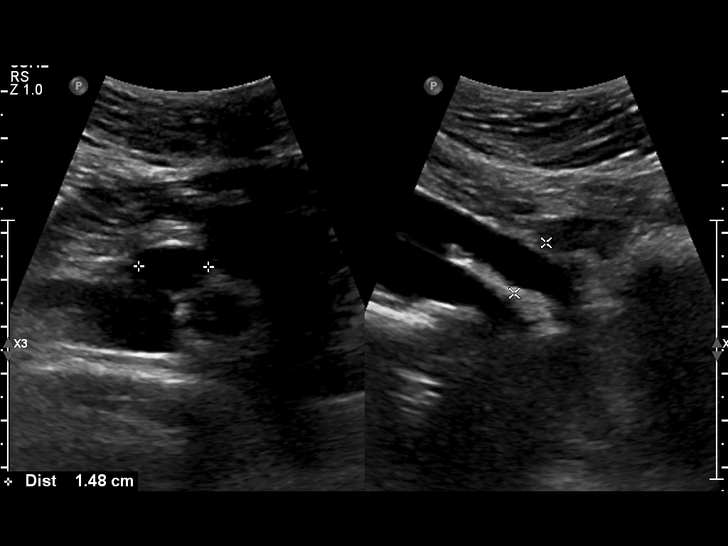
[im 13/19]
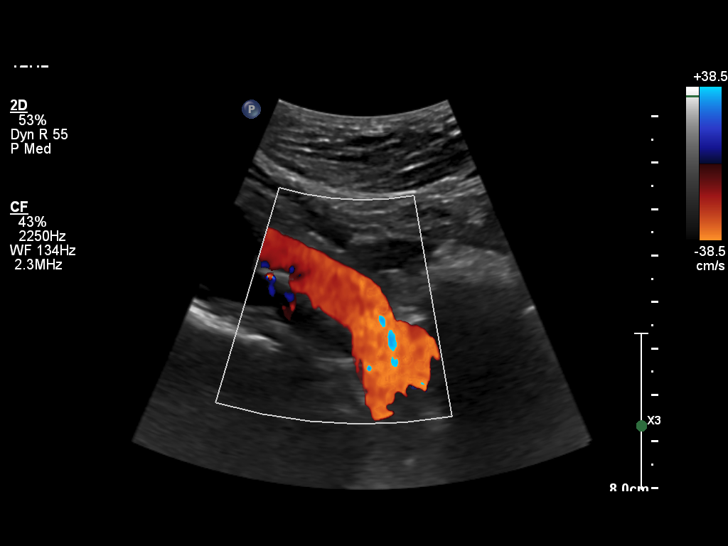
[im 15/19]
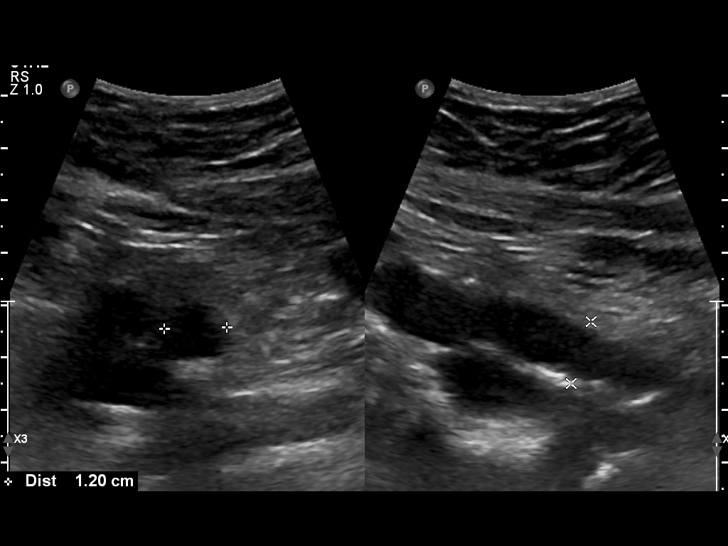
[im 16/19]
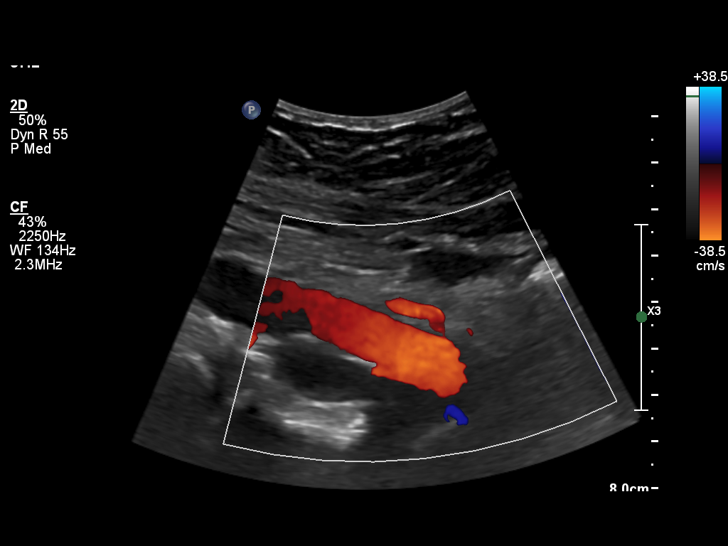
[im 17/19]
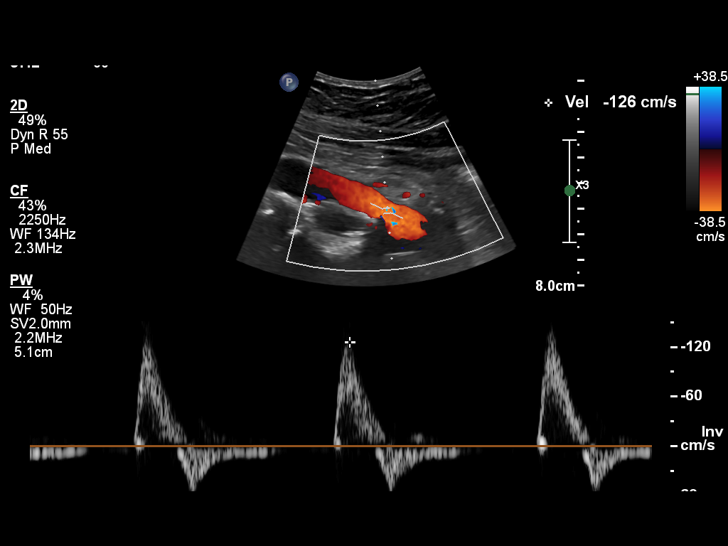
[im 19/19]
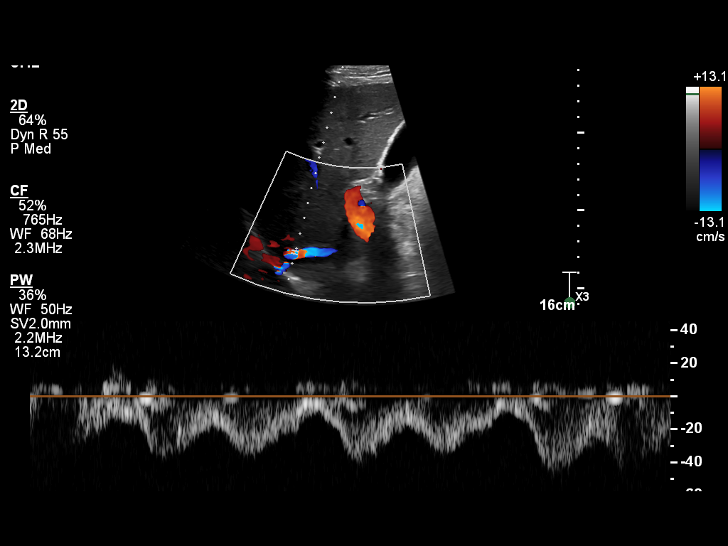

[14 of 19 positions shown; findings below may reference images not displayed]

DIAGNOSTIC STUDIES

EXAM

AAA ULTRASOUND

INDICATION

Screening
TB

TECHNIQUE

Real-time sonography of the aorta and proximal common iliacs is performed as a screening study using
2D  grayscale, color flow, and spectral pulse Doppler analysis.

COMPARISONS

None

FINDINGS

AORTA: Proximal aorta 1.9 x 2.2 cmMid aorta 1.7 x 1.6 cmDistal aorta 1.7 x 1.5 cm

COMMON ILIAC ARTERIES: Right 1.3 x 1.5 cm Left 1.2 x 1.2 cm

IVC: Patent.

Limited real-time sonography demonstrates no the abdominal aorta and proximal common iliac arteries
have a normal sonographic appearance. Specifically, no findings to suggest abdominal aortic aneurysm
are present. Complete abdominal imaging is not performed/ordered.

IMPRESSION

Normal real-time screening ultrasound of the abdominal aorta. No aneurysm. Complete abdominal
valuation is not performed/ordered

Tech Notes:

TB
